# Patient Record
Sex: Female | Born: 1970 | Race: Black or African American | Hispanic: No | Marital: Single | Smoking: Current every day smoker
Health system: Southern US, Community
[De-identification: ages and names within clinical notes are randomized; demographics above are authoritative.]

## PROBLEM LIST (undated history)

## (undated) DIAGNOSIS — N200 Calculus of kidney: Secondary | ICD-10-CM

## (undated) DIAGNOSIS — R319 Hematuria, unspecified: Secondary | ICD-10-CM

## (undated) DIAGNOSIS — R102 Pelvic and perineal pain: Secondary | ICD-10-CM

## (undated) HISTORY — DX: Hematuria, unspecified: R31.9

## (undated) HISTORY — PX: OVARIAN CYST REMOVAL: SHX89

## (undated) HISTORY — DX: Calculus of kidney: N20.0

## (undated) HISTORY — DX: Pelvic and perineal pain: R10.2

---

## 1998-10-05 HISTORY — PX: BREAST EXCISIONAL BIOPSY: SUR124

## 1998-10-05 HISTORY — PX: BREAST SURGERY: SHX581

## 2004-07-29 ENCOUNTER — Ambulatory Visit: Payer: Self-pay

## 2004-12-25 ENCOUNTER — Ambulatory Visit: Payer: Self-pay | Admitting: Unknown Physician Specialty

## 2007-07-22 ENCOUNTER — Ambulatory Visit: Payer: Self-pay | Admitting: Unknown Physician Specialty

## 2011-08-31 ENCOUNTER — Ambulatory Visit: Payer: Self-pay | Admitting: Obstetrics and Gynecology

## 2012-09-07 ENCOUNTER — Ambulatory Visit: Payer: Self-pay | Admitting: Obstetrics and Gynecology

## 2014-09-11 DIAGNOSIS — R102 Pelvic and perineal pain: Secondary | ICD-10-CM | POA: Insufficient documentation

## 2014-10-09 ENCOUNTER — Ambulatory Visit: Payer: Self-pay

## 2014-10-11 ENCOUNTER — Ambulatory Visit: Payer: Self-pay | Admitting: Obstetrics and Gynecology

## 2014-10-11 LAB — CBC WITH DIFFERENTIAL/PLATELET
BASOS PCT: 0.2 %
Basophil #: 0 10*3/uL (ref 0.0–0.1)
Eosinophil #: 0 10*3/uL (ref 0.0–0.7)
Eosinophil %: 0.6 %
HCT: 38.5 % (ref 35.0–47.0)
HGB: 12.4 g/dL (ref 12.0–16.0)
Lymphocyte #: 3.1 10*3/uL (ref 1.0–3.6)
Lymphocyte %: 57.2 %
MCH: 28 pg (ref 26.0–34.0)
MCHC: 32.1 g/dL (ref 32.0–36.0)
MCV: 87 fL (ref 80–100)
MONOS PCT: 8.8 %
Monocyte #: 0.5 x10 3/mm (ref 0.2–0.9)
Neutrophil #: 1.8 10*3/uL (ref 1.4–6.5)
Neutrophil %: 33.2 %
Platelet: 228 10*3/uL (ref 150–440)
RBC: 4.42 10*6/uL (ref 3.80–5.20)
RDW: 12.6 % (ref 11.5–14.5)
WBC: 5.4 10*3/uL (ref 3.6–11.0)

## 2014-10-11 LAB — BASIC METABOLIC PANEL
Anion Gap: 6 — ABNORMAL LOW (ref 7–16)
BUN: 10 mg/dL (ref 7–18)
CREATININE: 0.74 mg/dL (ref 0.60–1.30)
Calcium, Total: 8.8 mg/dL (ref 8.5–10.1)
Chloride: 106 mmol/L (ref 98–107)
Co2: 29 mmol/L (ref 21–32)
EGFR (Non-African Amer.): >60
Glucose: 84 mg/dL (ref 65–99)
Osmolality: 279 (ref 275–301)
Potassium: 3.5 mmol/L (ref 3.5–5.1)
SODIUM: 141 mmol/L (ref 136–145)

## 2014-10-22 ENCOUNTER — Ambulatory Visit: Payer: Self-pay | Admitting: Obstetrics and Gynecology

## 2014-11-06 DIAGNOSIS — N809 Endometriosis, unspecified: Secondary | ICD-10-CM | POA: Insufficient documentation

## 2015-01-28 LAB — SURGICAL PATHOLOGY

## 2015-02-03 NOTE — Op Note (Signed)
PATIENT NAME:  Carol Serrano, Carol Serrano MR#:  [id number] DATE OF BIRTH:  16-Nov-1970  DATE OF PROCEDURE:  10/22/2014  PREOPERATIVE DIAGNOSIS: Chronic pelvic pain.   POSTOPERATIVE DIAGNOSES: Chronic pelvic pain.   PROCEDURE PERFORMED: Diagnostic laparoscopy with bilateral salpingectomy.   SURGEON: Cline Cools, MD  ESTIMATED BLOOD LOSS: Minimal.   INTRAVENOUS FLUIDS: 700 mL    URINE OUTPUT: 75 mL    ANESTHESIA: General.   COMPLICATIONS: None.   FINDINGS: Pelvic adhesions in the left lower quadrant from the bladder to the uterus hypervascularity in pelvic vessels. No obvious evidence of endometriosis. Enlarged fallopian tubes, bilaterally.   INDICATION FOR PROCEDURE: Carol Serrano is a 44 year old female with a history of achy pelvic pain that worsens just before menses each month, but has not been relieved by oral contraceptive pills. She has a history of ovarian cysts with imaging that showed several 1 cm simple cysts, and the left 1 cm cyst with a single septation. She has previously undergone laparoscopy with ovarian cyst removal and improvement in her pain. She has a history of tubal ligation and cesarean section.   PROCEDURE IN DETAIL: The patient was brought to the Operating Room, where she was identified by name and birthdate. She was placed on the operating table in supine position and general anesthesia induced without difficulty. She was then repositioned in dorsal lithotomy position and prepped and draped in the usual sterile fashion.   A weighted speculum was placed in the vagina, and a single-tooth tenaculum was used to grasp the anterior lip of the vagina. Because the patient has a history of endometrial ablation, her cervix was unable to be dilated to introduce a uterine manipulator. For this reason, the instruments were removed from the vagina, and a sponge stick was placed in the vagina to manipulate the uterus.   A laparoscopic 10 cm port was placed in the umbilicus and  the 10 mm trocar was introduced under direct visualization. The abdomen was surveyed and no gross abnormalities were noted at the site of entry or in the upper quadrants of the abdomen. In the pelvis, the findings above were noticed. A 5 mm port was placed in the left lower quadrant and in the right lower quadrant at the site of prior scars. Full examination of the pelvis revealed bilaterally enlarged  fallopian tubes. The ovaries, however, looked within normal limits. There were pelvic adhesions in the left lower quadrant; these were taken down carefully to restore the normal anatomy of the left adnexa to the pelvic sidewall. There were no complications with this lysis of adhesions. The fallopian tubes were removed using a harmonic dissector bilaterally, and evaluation of the pedicles revealed no bleeding. The right lower quadrant incision was enlarged to accommodate a 10 mm Endo Catch bag. The tubes were placed in the bag, and removed from the pelvis. There was no evidence of endometriosis, and no biopsies were taken. There was very little free pelvic fluid and the ovaries looked overall grossly normal. The decision was made not to proceed with ovarian cystectomy.   The trocars were then removed from the abdomen under direct visualization in the bilateral lower quadrants. The patient's abdomen was desufflated and the adnexa were noted to be hemostatic, under less pressure. The umbilical port was then removed. The two 10 mm port site fascial defects were closed using 0 Vicryl suture, and subcuticular stitches were placed to close the skin. Steri-Strips were applied. The general anesthesia was reversed without difficulty.   The  patient is in stable condition in the PACU.    ____________________________ Cline CoolsBethany E. Jonahtan Manseau, MD beb:MT D: 10/22/2014 09:31:06 ET T: 10/22/2014 10:04:29 ET JOB#: [id number]  cc: Cline CoolsBethany E. Quron Ruddy, MD, <Dictator> Cline CoolsBETHANY E Nashali Ditmer MD ELECTRONICALLY SIGNED 10/30/2014 17:49

## 2015-03-25 ENCOUNTER — Other Ambulatory Visit: Payer: Self-pay | Admitting: Family Medicine

## 2015-03-25 DIAGNOSIS — R310 Gross hematuria: Secondary | ICD-10-CM

## 2015-05-07 ENCOUNTER — Other Ambulatory Visit: Payer: Self-pay

## 2015-05-07 DIAGNOSIS — N200 Calculus of kidney: Secondary | ICD-10-CM

## 2015-07-23 ENCOUNTER — Ambulatory Visit: Payer: Self-pay | Admitting: Urology

## 2015-08-06 ENCOUNTER — Encounter: Payer: Self-pay | Admitting: Urology

## 2015-08-06 ENCOUNTER — Ambulatory Visit (INDEPENDENT_AMBULATORY_CARE_PROVIDER_SITE_OTHER): Payer: 59 | Admitting: Urology

## 2015-08-06 ENCOUNTER — Ambulatory Visit
Admission: RE | Admit: 2015-08-06 | Discharge: 2015-08-06 | Disposition: A | Discharge status: Home / Self Care | Payer: 59 | Source: Ambulatory Visit | Attending: Urology | Admitting: Urology

## 2015-08-06 VITALS — BP 170/68 | HR 85 | Ht 65.0 in | Wt 152.3 lb

## 2015-08-06 DIAGNOSIS — N200 Calculus of kidney: Secondary | ICD-10-CM

## 2015-08-06 DIAGNOSIS — R310 Gross hematuria: Secondary | ICD-10-CM

## 2015-08-06 DIAGNOSIS — R319 Hematuria, unspecified: Secondary | ICD-10-CM | POA: Insufficient documentation

## 2015-08-06 LAB — URINALYSIS, COMPLETE
BILIRUBIN UA: NEGATIVE
Glucose, UA: NEGATIVE
KETONES UA: NEGATIVE
Leukocytes, UA: NEGATIVE
NITRITE UA: NEGATIVE
PH UA: 7 (ref 5.0–7.5)
Protein, UA: NEGATIVE
SPEC GRAV UA: 1.025 (ref 1.005–1.030)
Urobilinogen, Ur: 0.2 mg/dL (ref 0.2–1.0)

## 2015-08-06 LAB — MICROSCOPIC EXAMINATION
RENAL EPITHEL UA: NONE SEEN /HPF
WBC, UA: NONE SEEN /hpf (ref 0–?)

## 2015-08-06 NOTE — Progress Notes (Signed)
08/06/2015 11:20 AM   Carol Serrano Roanna Banning August 27, 1971 [id number]  Referring provider: No referring provider defined for this encounter.  Chief Complaint  Patient presents with  . Nephrolithiasis    3month w/KUB    HPI: Known right renal calculus    PMH: Past Medical History  Diagnosis Date  . Pelvic pain in female   . Hematuria   . Renal calculus     Surgical History: Past Surgical History  Procedure Laterality Date  . Ovarian cyst removal    . Breast surgery  2000  . Cesarean section      Home Medications:    Medication List    Notice  As of 08/06/2015 11:20 AM   You have not been prescribed any medications.      Allergies: No Known Allergies  Family History: Family History  Problem Relation Age of Onset  . Bladder Cancer Neg Hx   . Kidney cancer Neg Hx   . COPD Father   . Colon cancer    . Breast cancer    . Hypertension Mother   . Hypertension Father     Social History:  reports that she has been smoking Cigarettes.  She has been smoking about 0.50 packs per day. She does not have any smokeless tobacco history on file. She reports that she drinks alcohol. She reports that she does not use illicit drugs.  ROS: UROLOGY Frequent Urination?: No Hard to postpone urination?: No Burning/pain with urination?: No Get up at night to urinate?: No Leakage of urine?: No Urine stream starts and stops?: No Trouble starting stream?: No Do you have to strain to urinate?: No Blood in urine?: No Urinary tract infection?: No Sexually transmitted disease?: No Injury to kidneys or bladder?: No Painful intercourse?: No Weak stream?: No Currently pregnant?: No Vaginal bleeding?: No Last menstrual period?: n  Gastrointestinal Nausea?: No Vomiting?: No Indigestion/heartburn?: No Diarrhea?: No Constipation?: No  Constitutional Fever: No Night sweats?: No Weight loss?: No Fatigue?: No  Skin Skin rash/lesions?: No Itching?: No  Eyes Blurred vision?:  No Double vision?: No  Ears/Nose/Throat Sore throat?: No Sinus problems?: No  Hematologic/Lymphatic Swollen glands?: No Easy bruising?: No  Cardiovascular Leg swelling?: No Chest pain?: No  Respiratory Cough?: No Shortness of breath?: No  Endocrine Excessive thirst?: No  Musculoskeletal Back pain?: No Joint pain?: No  Neurological Headaches?: No Dizziness?: No  Psychologic Depression?: No Anxiety?: No  Physical Exam: BP 170/68 mmHg  Pulse 85  Ht  (1.651 m)  Wt 152 lb 4.8 oz (69.083 kg)  BMI 25.34 kg/m2  LMP 07/08/2015 (Exact Date)  Constitutional:  Alert and oriented, No acute distress. HEENT: [place] AT, moist mucus membranes.  Trachea midline, no masses. Cardiovascular: No clubbing, cyanosis, or edema. Respiratory: Normal respiratory effort, no increased work of breathing. GI: Abdomen is soft, nontender, nondistended, no abdominal masses GU: No CVA tenderness.  Skin: No rashes, bruises or suspicious lesions. Lymph: No cervical or inguinal adenopathy. Neurologic: Grossly intact, no focal deficits, moving all 4 extremities. Psychiatric: Normal mood and affect.  Laboratory Data: Lab Results  Component Value Date   WBC 5.4 10/11/2014   HGB 12.4 10/11/2014   HCT 38.5 10/11/2014   MCV 87 10/11/2014   PLT 228 10/11/2014    Lab Results  Component Value Date   CREATININE 0.74 10/11/2014    No results found for: PSA  No results found for: TESTOSTERONE  No results found for: HGBA1C  Urinalysis No results found for: COLORURINE, APPEARANCEUR, LABSPEC,  PHURINE, GLUCOSEU, HGBUR, BILIRUBINUR, KETONESUR, PROTEINUR, UROBILINOGEN, NITRITE, LEUKOCYTESUR  Pertinent Imaging: KUB  Assessment & Plan: KUB 8 months to follow progress of a right lower pole renal calculus that is gone from 2 mm to 3 mm over the last year   Abdomen 1 view (KUB); Future KUB in 1 year    No Follow-up on file.  Lorraine Laxichard D Hart, MD  [facility] [address], [address] [place], [place] [address] [phone]

## 2015-08-06 NOTE — Progress Notes (Signed)
Patient has been seen in the past. She has a right calculus in the lower pole right kidney that is nonobstructive. It is approximately 3 mm in size. It is increasingly elevated over the past 8 months plan another KUB months to recheck her major complaint is left flank pain in his flank and secured

## 2016-04-14 ENCOUNTER — Ambulatory Visit (INDEPENDENT_AMBULATORY_CARE_PROVIDER_SITE_OTHER): Payer: 59 | Admitting: Urology

## 2016-04-14 ENCOUNTER — Ambulatory Visit
Admission: RE | Admit: 2016-04-14 | Discharge: 2016-04-14 | Disposition: A | Discharge status: Home / Self Care | Payer: 59 | Source: Ambulatory Visit | Attending: Urology | Admitting: Urology

## 2016-04-14 ENCOUNTER — Encounter: Payer: Self-pay | Admitting: Urology

## 2016-04-14 VITALS — BP 163/79 | HR 78 | Ht 65.0 in | Wt 151.0 lb

## 2016-04-14 DIAGNOSIS — N200 Calculus of kidney: Secondary | ICD-10-CM

## 2016-04-14 DIAGNOSIS — R3129 Other microscopic hematuria: Secondary | ICD-10-CM | POA: Diagnosis not present

## 2016-04-14 LAB — MICROSCOPIC EXAMINATION

## 2016-04-14 LAB — URINALYSIS, COMPLETE
Bilirubin, UA: NEGATIVE
GLUCOSE, UA: NEGATIVE
KETONES UA: NEGATIVE
LEUKOCYTES UA: NEGATIVE
Nitrite, UA: NEGATIVE
Protein, UA: NEGATIVE
Specific Gravity, UA: 1.025 (ref 1.005–1.030)
Urobilinogen, Ur: 0.2 mg/dL (ref 0.2–1.0)
pH, UA: 5.5 (ref 5.0–7.5)

## 2016-04-14 NOTE — Progress Notes (Signed)
04/14/2016 2:56 PM   Marily Memos Roanna Banning Mar 03, 1971 [id number]  Referring provider: No referring provider defined for this encounter.  Chief Complaint  Patient presents with  . Nephrolithiasis    53month w/KUB    HPI: 45 year old female who presents today for routine follow-up.   She was previously followed by Dr. Edwyna Shell and was last seen in 08/2015. She is a personal history of kidney stones and was being followed for a ? right lower pole nonobstructing stone per Dr. Scheryl Darter last note had increased in size to 3 mm from 2 mm.  Review of KUB last year shows no obvious stones.  She is never had surgical intervention for stone in the past.  Today, she has no urinary complaints. She denies gross hematuria or urinary tract infections. She has no flank pain.  KUB today is negative with no stones identified within the urinary tract.  She does have incidental microscopic hematuria today.  She is a current smoker.  PMH: Past Medical History  Diagnosis Date  . Pelvic pain in female   . Hematuria   . Renal calculus     Surgical History: Past Surgical History  Procedure Laterality Date  . Ovarian cyst removal    . Breast surgery  2000  . Cesarean section      Home Medications:    Medication List    Notice  As of 04/14/2016 11:59 PM   You have not been prescribed any medications.      Allergies: No Known Allergies  Family History: Family History  Problem Relation Age of Onset  . Bladder Cancer Neg Hx   . Kidney cancer Neg Hx   . COPD Father   . Colon cancer    . Breast cancer    . Hypertension Mother   . Hypertension Father     Social History:  reports that she has been smoking Cigarettes.  She has been smoking about 0.50 packs per day. She does not have any smokeless tobacco history on file. She reports that she drinks alcohol. She reports that she does not use illicit drugs.  ROS: UROLOGY Frequent Urination?: No Hard to postpone urination?: No Burning/pain with  urination?: No Get up at night to urinate?: No Leakage of urine?: No Urine stream starts and stops?: No Trouble starting stream?: No Do you have to strain to urinate?: No Blood in urine?: No Sexually transmitted disease?: No Injury to kidneys or bladder?: No Painful intercourse?: No Weak stream?: No Currently pregnant?: No Vaginal bleeding?: No Last menstrual period?: n  Gastrointestinal Nausea?: No Vomiting?: No Indigestion/heartburn?: No Diarrhea?: No Constipation?: No  Constitutional Fever: No Night sweats?: No Weight loss?: No Fatigue?: No  Skin Skin rash/lesions?: No Itching?: No  Eyes Blurred vision?: No Double vision?: No  Ears/Nose/Throat Sore throat?: No Sinus problems?: No  Hematologic/Lymphatic Swollen glands?: No Easy bruising?: No  Cardiovascular Leg swelling?: No Chest pain?: No  Respiratory Cough?: No Shortness of breath?: No  Endocrine Excessive thirst?: No  Musculoskeletal Back pain?: Yes Joint pain?: No  Neurological Headaches?: No Dizziness?: No  Psychologic Depression?: No Anxiety?: No  Physical Exam: BP 163/79 mmHg  Pulse 78  Ht  (1.651 m)  Wt 151 lb (68.493 kg)  BMI 25.13 kg/m2  LMP 03/21/2016  Constitutional:  Alert and oriented, No acute distress. HEENT: [place] AT, moist mucus membranes.  Trachea midline, no masses. Cardiovascular: No clubbing, cyanosis, or edema. Respiratory: Normal respiratory effort, no increased work of breathing. GI: Abdomen is soft, nontender, nondistended,  no abdominal masses GU: No CVA tenderness.  Skin: No rashes, bruises or suspicious lesions. Neurologic: Grossly intact, no focal deficits, moving all 4 extremities. Psychiatric: Normal mood and affect.  Laboratory Data: Lab Results  Component Value Date   WBC 5.4 10/11/2014   HGB 12.4 10/11/2014   HCT 38.5 10/11/2014   MCV 87 10/11/2014   PLT 228 10/11/2014    Lab Results  Component Value Date   CREATININE 0.74 10/11/2014    Urinalysis Results for orders placed or performed in visit on 04/14/16  Microscopic Examination  Result Value Ref Range   WBC, UA 0-5 0 -  5 /hpf   RBC, UA 3-10 (A) 0 -  2 /hpf   Epithelial Cells (non renal) 0-10 0 - 10 /hpf   Mucus, UA Present (A) Not Estab.   Bacteria, UA Few (A) None seen/Few  Urinalysis, Complete  Result Value Ref Range   Specific Gravity, UA 1.025 1.005 - 1.030   pH, UA 5.5 5.0 - 7.5   Color, UA Yellow Yellow   Appearance Ur Clear Clear   Leukocytes, UA Negative Negative   Protein, UA Negative Negative/Trace   Glucose, UA Negative Negative   Ketones, UA Negative Negative   RBC, UA 1+ (A) Negative   Bilirubin, UA Negative Negative   Urobilinogen, Ur 0.2 0.2 - 1.0 mg/dL   Nitrite, UA Negative Negative   Microscopic Examination See below:     Pertinent Imaging:    Study Result     CLINICAL DATA: Renal calculus.  EXAM: ABDOMEN - 1 VIEW  COMPARISON: 08/06/2015.  FINDINGS: Bilateral pelvic phleboliths are unchanged. No calcified urinary tract calculi are seen. Normal bowel gas pattern and unremarkable bones.  IMPRESSION: No calcified urinary tract calculi seen.   Electronically Signed  By: Beckie SaltsSteven Reid M.D.  On: 04/14/2016 16:27    KUB reviewed today personally and with the patient. Also compared to a previous KUB and CT scans.  Assessment & Plan:    1. Nephrolithiasis No appreciable stones on previous 2 KUB examinations. CT scan in the past shown punctate calcifications only. I do not think that she is further follow-up for this. She is otherwise asymptomatic and not pass any stones in the recent past.   We did review tone prevention techniques today in detail, handout information given - Urinalysis, Complete  2. Microscopic hematuria Incidental microscopic hematuria 1 in a smoker today. Currently, she does not need AUA guidelines for hematuria workup -must have 2 episodes in the absence of infection in one year I have  recommended a repeat urinalysis to assess further   Return for UA- lab only.  Vanna ScotlandAshley Aryn Safran, MD  [facility] [address], [address] [place], [place] [address] [phone]

## 2016-04-14 NOTE — Patient Instructions (Signed)
Dietary Guidelines to Help Prevent Kidney Stones Your risk of kidney stones can be decreased by adjusting the foods you eat. The most important thing you can do is drink enough fluid. You should drink enough fluid to keep your urine clear or pale yellow. The following guidelines provide specific information for the type of kidney stone you have had. GUIDELINES ACCORDING TO TYPE OF KIDNEY STONE Calcium Oxalate Kidney Stones  Reduce the amount of salt you eat. Foods that have a lot of salt cause your body to release excess calcium into your urine. The excess calcium can combine with a substance called oxalate to form kidney stones.  Reduce the amount of animal protein you eat if the amount you eat is excessive. Animal protein causes your body to release excess calcium into your urine. Ask your dietitian how much protein from animal sources you should be eating.  Avoid foods that are high in oxalates. If you take vitamins, they should have less than 500 mg of vitamin C. Your body turns vitamin C into oxalates. You do not need to avoid fruits and vegetables high in vitamin C. Calcium Phosphate Kidney Stones  Reduce the amount of salt you eat to help prevent the release of excess calcium into your urine.  Reduce the amount of animal protein you eat if the amount you eat is excessive. Animal protein causes your body to release excess calcium into your urine. Ask your dietitian how much protein from animal sources you should be eating.  Get enough calcium from food or take a calcium supplement (ask your dietitian for recommendations). Food sources of calcium that do not increase your risk of kidney stones include:  Broccoli.  Dairy products, such as cheese and yogurt.  Pudding. Uric Acid Kidney Stones  Do not have more than 6 oz of animal protein per day. FOOD SOURCES Animal Protein Sources  Meat (all types).  Poultry.  Eggs.  Fish, seafood. Foods High in Salt  Salt seasonings.  Soy  sauce.  Teriyaki sauce.  Cured and processed meats.  Salted crackers and snack foods.  Fast food.  Canned soups and most canned foods. Foods High in Oxalates  Grains:  Amaranth.  Barley.  Grits.  Wheat germ.  Bran.  Buckwheat flour.  All bran cereals.  Pretzels.  Whole wheat bread.  Vegetables:  Beans (wax).  Beets and beet greens.  Collard greens.  Eggplant.  Escarole.  Leeks.  Okra.  Parsley.  Rutabagas.  Spinach.  Swiss chard.  Tomato paste.  Fried potatoes.  Sweet potatoes.  Fruits:  Red currants.  Figs.  Kiwi.  Rhubarb.  Meat and Other Protein Sources:  Beans (dried).  Soy burgers and other soybean products.  Miso.  Nuts (peanuts, almonds, pecans, cashews, hazelnuts).  Nut butters.  Sesame seeds and tahini (paste made of sesame seeds).  Poppy seeds.  Beverages:  Chocolate drink mixes.  Soy milk.  Instant iced tea.  Juices made from high-oxalate fruits or vegetables.  Other:  Carob.  Chocolate.  Fruitcake.  Marmalades.   This information is not intended to replace advice given to you by your health care provider. Make sure you discuss any questions you have with your health care provider.   Document Released: 01/16/2011 Document Revised: 09/26/2013 Document Reviewed: 08/18/2013 Elsevier Interactive Patient Education 2016 [employer] Inc[employer]  

## 2016-04-20 ENCOUNTER — Telehealth: Payer: Self-pay | Admitting: Urology

## 2016-04-20 ENCOUNTER — Encounter: Payer: Self-pay | Admitting: Urology

## 2016-04-20 DIAGNOSIS — R319 Hematuria, unspecified: Secondary | ICD-10-CM

## 2016-04-20 NOTE — Telephone Encounter (Signed)
Please note this patient known that she had incidental microscopic blood in her urine today. This was not appreciated on her previous urinalysis.  I think is important given her smoking history that she repeat a urinalysis to confirm a or dispute its presence. Please let her know that the guidelines recommend further workup if she does have 2 episodes within one year.  These arrange her to come back in again for a repeat urine specimen with the proper collection technique.  Vanna ScotlandAshley Jakylah Bassinger, MD

## 2016-04-20 NOTE — Telephone Encounter (Signed)
Spoke with pt in reference to micro u/a. Pt will RTC tomorrow for clean catch specimen. Orders placed.

## 2016-04-21 ENCOUNTER — Other Ambulatory Visit: Payer: 59

## 2016-04-21 ENCOUNTER — Encounter: Payer: Self-pay | Admitting: Urology

## 2016-04-21 DIAGNOSIS — R319 Hematuria, unspecified: Secondary | ICD-10-CM

## 2016-04-21 LAB — URINALYSIS, COMPLETE
BILIRUBIN UA: NEGATIVE
Glucose, UA: NEGATIVE
Ketones, UA: NEGATIVE
LEUKOCYTES UA: NEGATIVE
Nitrite, UA: NEGATIVE
PH UA: 6 (ref 5.0–7.5)
Protein, UA: NEGATIVE
Urobilinogen, Ur: 0.2 mg/dL (ref 0.2–1.0)

## 2016-04-21 LAB — MICROSCOPIC EXAMINATION: BACTERIA UA: NONE SEEN

## 2016-04-23 ENCOUNTER — Telehealth: Payer: Self-pay

## 2016-04-23 NOTE — Telephone Encounter (Signed)
-----   Message from Vanna ScotlandAshley Brandon, MD sent at 04/21/2016  3:25 PM EDT ----- UA looks good today, no further work up needed  Vanna ScotlandAshley Brandon, MD

## 2016-04-23 NOTE — Telephone Encounter (Signed)
Spoke with pt in reference to u/a results. Pt voiced understanding.  

## 2016-12-24 ENCOUNTER — Other Ambulatory Visit: Payer: Self-pay | Admitting: Obstetrics and Gynecology

## 2016-12-24 DIAGNOSIS — Z1231 Encounter for screening mammogram for malignant neoplasm of breast: Secondary | ICD-10-CM

## 2017-01-26 ENCOUNTER — Ambulatory Visit
Admission: RE | Admit: 2017-01-26 | Discharge: 2017-01-26 | Disposition: A | Discharge status: Home / Self Care | Payer: 59 | Source: Ambulatory Visit | Attending: Obstetrics and Gynecology | Admitting: Obstetrics and Gynecology

## 2017-01-26 DIAGNOSIS — Z1231 Encounter for screening mammogram for malignant neoplasm of breast: Secondary | ICD-10-CM | POA: Insufficient documentation

## 2017-02-08 ENCOUNTER — Inpatient Hospital Stay
Admission: RE | Admit: 2017-02-08 | Discharge: 2017-02-08 | Disposition: A | Discharge status: Home / Self Care | Payer: Self-pay | Source: Ambulatory Visit | Attending: *Deleted | Admitting: *Deleted

## 2017-02-08 ENCOUNTER — Other Ambulatory Visit: Payer: Self-pay | Admitting: *Deleted

## 2017-02-08 DIAGNOSIS — Z9289 Personal history of other medical treatment: Secondary | ICD-10-CM

## 2017-07-19 ENCOUNTER — Emergency Department
Admission: EM | Admit: 2017-07-19 | Discharge: 2017-07-19 | Disposition: A | Discharge status: Home / Self Care | Payer: 59 | Attending: Emergency Medicine | Admitting: Emergency Medicine

## 2017-07-19 DIAGNOSIS — I83813 Varicose veins of bilateral lower extremities with pain: Secondary | ICD-10-CM | POA: Diagnosis not present

## 2017-07-19 DIAGNOSIS — R200 Anesthesia of skin: Secondary | ICD-10-CM | POA: Diagnosis not present

## 2017-07-19 DIAGNOSIS — R609 Edema, unspecified: Secondary | ICD-10-CM

## 2017-07-19 DIAGNOSIS — F17210 Nicotine dependence, cigarettes, uncomplicated: Secondary | ICD-10-CM | POA: Insufficient documentation

## 2017-07-19 DIAGNOSIS — I8393 Asymptomatic varicose veins of bilateral lower extremities: Secondary | ICD-10-CM

## 2017-07-19 DIAGNOSIS — R202 Paresthesia of skin: Secondary | ICD-10-CM | POA: Insufficient documentation

## 2017-07-19 DIAGNOSIS — R600 Localized edema: Secondary | ICD-10-CM | POA: Diagnosis present

## 2017-07-19 LAB — URINALYSIS, COMPLETE (UACMP) WITH MICROSCOPIC
BILIRUBIN URINE: NEGATIVE
Glucose, UA: NEGATIVE mg/dL
Ketones, ur: NEGATIVE mg/dL
LEUKOCYTES UA: NEGATIVE
NITRITE: NEGATIVE
PH: 5 (ref 5.0–8.0)
Protein, ur: NEGATIVE mg/dL
SPECIFIC GRAVITY, URINE: 1.024 (ref 1.005–1.030)

## 2017-07-19 LAB — CBC
HEMATOCRIT: 37.9 % (ref 35.0–47.0)
Hemoglobin: 12.3 g/dL (ref 12.0–16.0)
MCH: 28 pg (ref 26.0–34.0)
MCHC: 32.5 g/dL (ref 32.0–36.0)
MCV: 86.4 fL (ref 80.0–100.0)
PLATELETS: 182 10*3/uL (ref 150–440)
RBC: 4.38 MIL/uL (ref 3.80–5.20)
RDW: 12.8 % (ref 11.5–14.5)
WBC: 5.6 10*3/uL (ref 3.6–11.0)

## 2017-07-19 LAB — BASIC METABOLIC PANEL
Anion gap: 7 (ref 5–15)
BUN: 16 mg/dL (ref 6–20)
CO2: 27 mmol/L (ref 22–32)
Calcium: 9.1 mg/dL (ref 8.9–10.3)
Chloride: 105 mmol/L (ref 101–111)
Creatinine, Ser: 0.67 mg/dL (ref 0.44–1.00)
Glucose, Bld: 93 mg/dL (ref 65–99)
POTASSIUM: 3.4 mmol/L — AB (ref 3.5–5.1)
SODIUM: 139 mmol/L (ref 135–145)

## 2017-07-19 LAB — POCT PREGNANCY, URINE: Preg Test, Ur: NEGATIVE

## 2017-07-19 NOTE — ED Notes (Signed)
Pt able to ambulate at discharge without difficulty. NAD noted at this itme.

## 2017-07-19 NOTE — ED Notes (Signed)
Pt presents with unexplained bruising and tingling in her legs. Bruising started about 3 weeks ago, and tingling started Saturday. Denies any injury; states "my legs feel tired." Pt denies any medication change or lifestyle changes to explain the leg fatigue. Pt alert & oriented with NAD noted.

## 2017-07-19 NOTE — ED Triage Notes (Signed)
Bilateral lower leg weakness, bruising to legs without cause. Pt alert and oriented X4, active, cooperative, pt in NAD. RR even and unlabored, color WNL.

## 2017-07-19 NOTE — ED Provider Notes (Signed)
[facility] Emergency Department Provider Note  ____________________________________________   First MD Initiated Contact with Patient 07/19/17 1513     (approximate)  I have reviewed the triage vital signs and the nursing notes.   HISTORY  Chief Complaint Weakness   HPI Carol Serrano is a 46 y.o. female who is presenting to the emergency department today with several weeks of intermittent lower extremity swelling and now 2 days of numbness and tingling to the bilateral lower extremities from the knees down. She says that she is also noticed bruising without known trauma to the bilateral lower extremities. She says that she is also noticed new veins that have been popping up to the bilateral lower extremities. Says that the edema goes down overnight and is not there when she wakes up in the morning. Says that her symptoms worsen when she wears high heels and improve when she wears flat shoes. Denies any gum bleeding with tooth brushing. Does not report any nosebleeds.   Past Medical History:  Diagnosis Date  . Hematuria   . Pelvic pain in female   . Renal calculus     Patient Active Problem List   Diagnosis Date Noted  . Endometriosis 11/06/2014  . Adnexal pain 09/11/2014    Past Surgical History:  Procedure Laterality Date  . BREAST EXCISIONAL BIOPSY Left 2000   benign  . BREAST SURGERY  2000  . CESAREAN SECTION    . OVARIAN CYST REMOVAL      Prior to Admission medications   Not on File    Allergies Patient has no known allergies.  Family History  Problem Relation Age of Onset  . COPD Father   . Hypertension Father   . Colon cancer Unknown   . Hypertension Mother   . Breast cancer Maternal Aunt        late 21's early 37's  . Breast cancer Maternal Grandmother        mid to late 60's  . Breast cancer Paternal Aunt   . Bladder Cancer Neg Hx   . Kidney cancer Neg Hx     Social History Social History  Substance Use Topics  .  Smoking status: Current Every Day Smoker    Packs/day: 0.50    Types: Cigarettes  . Smokeless tobacco: Not on file  . Alcohol use 0.0 oz/week     Comment: occasional    Review of Systems  Constitutional: No fever/chills Eyes: No visual changes. ENT: No sore throat. Cardiovascular: Denies chest pain. Respiratory: Denies shortness of breath. Gastrointestinal: No abdominal pain.  No nausea, no vomiting.  No diarrhea.  No constipation. Genitourinary: Negative for dysuria. Musculoskeletal: Negative for back pain. Skin: Negative for rash. Neurological: Negative for headaches   ____________________________________________   PHYSICAL EXAM:  VITAL SIGNS: ED Triage Vitals  Enc Vitals Group     BP 07/19/17 1433 (!) 156/85     Pulse Rate 07/19/17 1433 72     Resp 07/19/17 1433 18     Temp 07/19/17 1433 98.1 F (36.7 C)     Temp Source 07/19/17 1433 Oral     SpO2 07/19/17 1433 99 %     Weight 07/19/17 1434 135 lb (61.2 kg)     Height 07/19/17 1434  (1.651 m)     Head Circumference --      Peak Flow --      Pain Score --      Pain Loc --      Pain  Edu? --      Excl. in GC? --     Constitutional: Alert and oriented. Well appearing and in no acute distress. Eyes: Conjunctivae are normal.  Head: Atraumatic. Nose: No congestion/rhinnorhea. Mouth/Throat: Mucous membranes are moist.  Neck: No stridor.   Cardiovascular: Normal rate, regular rhythm. Grossly normal heart sounds.  Good peripheral circulation with equal and bilateral dorsalis pedis pulses. Respiratory: Normal respiratory effort.  No retractions. Lungs CTAB. Gastrointestinal: Soft and nontender. No distention. No CVA tenderness. Musculoskeletal: No lower extremity tenderness nor edema.  No joint effusions. no swelling to bilateral lower extremities at this time. Patient is 5 out of 5 strength to bilateral lower extremity. Sensation is intact to light touch bilaterally to the lower extremity is. Small areas of  ecchymosis to the bilateral lower extremities with largest on the left lateral thigh being about 4 cm, round and nontender. Patient also with multiple areas of small varicose veins without any ulceration or active bleeding.  Neurologic:  Normal speech and language. No gross focal neurologic deficits are appreciated. Skin:  Skin is warm, dry and intact. No rash noted. Psychiatric: Mood and affect are normal. Speech and behavior are normal.  ____________________________________________   LABS (all labs ordered are listed, but only abnormal results are displayed)  Labs Reviewed  BASIC METABOLIC PANEL - Abnormal; Notable for the following:       Result Value   Potassium 3.4 (*)    All other components within normal limits  URINALYSIS, COMPLETE (UACMP) WITH MICROSCOPIC - Abnormal; Notable for the following:    Color, Urine YELLOW (*)    APPearance CLEAR (*)    Hgb urine dipstick MODERATE (*)    Bacteria, UA RARE (*)    Squamous Epithelial / LPF 0-5 (*)    All other components within normal limits  CBC  POC URINE PREG, ED  POCT PREGNANCY, URINE  CBG MONITORING, ED   ____________________________________________  EKG  ED ECG REPORT I, Arelia Longest, the attending physician, personally viewed and interpreted this ECG.   Date: 07/19/2017  EKG Time: 1435  Rate: 70  Rhythm: normal sinus rhythm  Axis: normal  Intervals:none  ST&T Change: no ST segment elevation or depression. No abnormal T-wave inversion.  ____________________________________________  RADIOLOGY   ____________________________________________   PROCEDURES  Procedure(s) performed:   Procedures  Critical Care performed:   ____________________________________________   INITIAL IMPRESSION / ASSESSMENT AND PLAN / ED COURSE  Pertinent labs & imaging results that were available during my care of the patient were reviewed by me and considered in my medical decision making (see chart for  details).  DDX: Peripheral edema, dependent edema,varicose veins, peripheral neuropathy,  As part of my medical decision making, I reviewed the following data within the electronic MEDICAL RECORD NUMBER Old chart reviewed  we discussed compression stockings as well as keeping the legs elevated. I also discussed with the patient the need to increase her exercise like walking. She is understanding of these plans one to comply. We also discussed the likely diagnosis and the need for follow-up with primary care as well as with the vascular surgeon. The patient is understanding willing to comply and will pursue these plans. I feel that is likely the edema that is causing the patient's numbness and tingling.         ____________________________________________   FINAL CLINICAL IMPRESSION(S) / ED DIAGNOSES  peripheral edema. Varicose veins.    NEW MEDICATIONS STARTED DURING THIS VISIT:  New Prescriptions   No  medications on file     Note:  This document was prepared using Dragon voice recognition software and may include unintentional dictation errors.     Myrna Blazer, MD 07/19/17 [phone]

## 2017-08-02 ENCOUNTER — Encounter (INDEPENDENT_AMBULATORY_CARE_PROVIDER_SITE_OTHER): Payer: Self-pay | Admitting: Vascular Surgery

## 2017-08-02 ENCOUNTER — Ambulatory Visit (INDEPENDENT_AMBULATORY_CARE_PROVIDER_SITE_OTHER): Payer: 59 | Admitting: Vascular Surgery

## 2017-08-02 VITALS — BP 149/74 | HR 81 | Resp 17 | Ht 65.0 in | Wt 147.0 lb

## 2017-08-02 DIAGNOSIS — M79605 Pain in left leg: Secondary | ICD-10-CM

## 2017-08-02 DIAGNOSIS — M79604 Pain in right leg: Secondary | ICD-10-CM

## 2017-08-02 NOTE — Progress Notes (Signed)
Subjective:    Patient ID: Carol Serrano, female    DOB: 09-Aug-1971, 46 y.o.   MRN: [id number] Chief Complaint  Patient presents with  . Varicose Veins   Presents as a new patient referred by a [facility] emergency room with a chief complaint of bilateral lower extremity pain. Patient endorses tingling and burning to her bilateral feet which has worsened over the last few weeks. Patient does experience bruising to the bilateral lower extremities. This bruising is usually associated with a "knot" that she can palpate. The patient does experience some bilateral lower extremity swelling if she stands for long periods of time. Patient denies any surgery or trauma to her lower extremity. Patient denies any history of DVT. Patient denies any claudication-like history, rest pain or ulcerations or lower extremity. At this time, the patient does not wear medical grade 1 compression stockings or elevate her legs. The patient denies any fever, nausea or vomiting.   Review of Systems  Constitutional: Negative.   HENT: Negative.   Eyes: Negative.   Respiratory: Negative.   Cardiovascular:       Painful varicose veins.  Gastrointestinal: Negative.   Endocrine: Negative.   Genitourinary: Negative.   Musculoskeletal: Negative.   Skin: Negative.   Allergic/Immunologic: Negative.   Neurological: Negative.   Hematological: Negative.   Psychiatric/Behavioral: Negative.       Objective:   Physical Exam  Constitutional: She is oriented to person, place, and time. She appears well-developed and well-nourished. No distress.  HENT:  Head: Normocephalic.  Eyes: Pupils are equal, round, and reactive to light. Conjunctivae are normal.  Neck: Normal range of motion.  Cardiovascular: Normal rate, regular rhythm, normal heart sounds and intact distal pulses.   Pulses:      Radial pulses are 2+ on the right side, and 2+ on the left side.       Dorsalis pedis pulses are 2+ on the right  side, and 2+ on the left side.       Posterior tibial pulses are 2+ on the right side, and 2+ on the left side.  Pulmonary/Chest: Effort normal.  Musculoskeletal: Normal range of motion. Edema: Mild bilateral lower extremity edema.  Neurological: She is alert and oriented to person, place, and time.  Skin: Skin is warm and dry. She is not diaphoretic.  There is no stasis dermatitis. There is no skin changes. There is no cellulitis. Less than 1cm scattered varicosities noted bilaterally. Scattered bruising to the lower extremity (approximately four) bilaterally  Psychiatric: She has a normal mood and affect. Her behavior is normal. Judgment and thought content normal.  Vitals reviewed.  BP (!) 149/74 (BP Location: Right Arm)   Pulse 81   Resp 17   Ht 5\' 5"  (1.651 m)   Wt 147 lb (66.7 kg)   LMP 07/12/2017 (Approximate)   BMI 24.46 kg/m   Past Medical History:  Diagnosis Date  . Hematuria   . Pelvic pain in female   . Renal calculus    Social History   Social History  . Marital status: Single    Spouse name: N/A  . Number of children: N/A  . Years of education: N/A   Occupational History  . Not on file.   Social History Main Topics  . Smoking status: Current Every Day Smoker    Packs/day: 0.50    Types: Cigarettes  . Smokeless tobacco: Never Used  . Alcohol use 0.0 oz/week     Comment: occasional  .  Drug use: No  . Sexual activity: Not on file   Other Topics Concern  . Not on file   Social History Narrative  . No narrative on file   Past Surgical History:  Procedure Laterality Date  . BREAST EXCISIONAL BIOPSY Left 2000   benign  . BREAST SURGERY  2000  . CESAREAN SECTION    . OVARIAN CYST REMOVAL     Family History  Problem Relation Age of Onset  . COPD Father   . Hypertension Father   . Colon cancer Unknown   . Hypertension Mother   . Breast cancer Maternal Aunt        late 3540's early 4350's  . Breast cancer Maternal Grandmother        mid to late 60's   . Breast cancer Paternal Aunt   . Bladder Cancer Neg Hx   . Kidney cancer Neg Hx    No Known Allergies     Assessment & Plan:  Presents as a new patient referred by a [facility] emergency room with a chief complaint of bilateral lower extremity pain. Patient endorses tingling and burning to her bilateral feet which has worsened over the last few weeks. Patient does experience bruising to the bilateral lower extremities. This bruising is usually associated with a "knot" that she can palpate. The patient does experience some bilateral lower extremity swelling if she stands for long periods of time. Patient denies any surgery or trauma to her lower extremity. Patient denies any history of DVT. Patient denies any claudication-like history, rest pain or ulcerations or lower extremity. At this time, the patient does not wear medical grade 1 compression stockings or elevate her legs. The patient denies any fever, nausea or vomiting.  1. Pain in both lower extremities - New Patient presents with bilateral lower extremity pain and bruising. There is no acute vascular compromise to her bilateral lower extremity. Her discomfort has progressed to the point she is unable to function on a daily basis. I will order an ABI to rule out any contributing peripheral artery disease I will also order a venous duplex to rule out any contributing venous disease. The patient was encouraged to wear graduated compression stockings (20-30 mmHg) on a daily basis. The patient was instructed to begin wearing the stockings first thing in the morning and removing them in the evening. The patient was instructed specifically not to sleep in the stockings. Prescription given In addition, behavioral modification including elevation during the day will be initiated. Anti-inflammatories for pain.  No current outpatient prescriptions on file prior to visit.   No current facility-administered medications on  file prior to visit.     There are no Patient Instructions on file for this visit. No Follow-up on file.   [name] A Phong Isenberg, PA-C

## 2017-11-03 ENCOUNTER — Encounter (INDEPENDENT_AMBULATORY_CARE_PROVIDER_SITE_OTHER): Payer: 59

## 2017-11-03 ENCOUNTER — Ambulatory Visit (INDEPENDENT_AMBULATORY_CARE_PROVIDER_SITE_OTHER): Payer: 59 | Admitting: Vascular Surgery

## 2017-11-23 ENCOUNTER — Other Ambulatory Visit (INDEPENDENT_AMBULATORY_CARE_PROVIDER_SITE_OTHER): Payer: Self-pay | Admitting: Vascular Surgery

## 2017-11-23 DIAGNOSIS — M79662 Pain in left lower leg: Principal | ICD-10-CM

## 2017-11-23 DIAGNOSIS — M79661 Pain in right lower leg: Secondary | ICD-10-CM

## 2017-11-24 ENCOUNTER — Ambulatory Visit (INDEPENDENT_AMBULATORY_CARE_PROVIDER_SITE_OTHER): Payer: 59

## 2017-11-24 ENCOUNTER — Encounter (INDEPENDENT_AMBULATORY_CARE_PROVIDER_SITE_OTHER): Payer: Self-pay

## 2017-11-24 ENCOUNTER — Ambulatory Visit (INDEPENDENT_AMBULATORY_CARE_PROVIDER_SITE_OTHER): Payer: 59 | Admitting: Vascular Surgery

## 2017-11-24 ENCOUNTER — Encounter (INDEPENDENT_AMBULATORY_CARE_PROVIDER_SITE_OTHER): Payer: Self-pay | Admitting: Vascular Surgery

## 2017-11-24 VITALS — BP 139/78 | HR 64 | Resp 16 | Ht 65.0 in | Wt 146.0 lb

## 2017-11-24 DIAGNOSIS — M79661 Pain in right lower leg: Secondary | ICD-10-CM | POA: Diagnosis not present

## 2017-11-24 DIAGNOSIS — I890 Lymphedema, not elsewhere classified: Secondary | ICD-10-CM | POA: Diagnosis not present

## 2017-11-24 DIAGNOSIS — M79662 Pain in left lower leg: Secondary | ICD-10-CM

## 2017-11-24 DIAGNOSIS — I872 Venous insufficiency (chronic) (peripheral): Secondary | ICD-10-CM | POA: Diagnosis not present

## 2017-11-24 NOTE — Progress Notes (Signed)
Subjective:    Patient ID: Carol Serrano, female    DOB: 09/11/1971, 47 y.o.   MRN: [id number] Chief Complaint  Patient presents with  . Follow-up    Bilateral venous reflux   The patient presents to review vascular studies.  The patient was last seen on August 02, 2017 for evaluation of bilateral lower extremity edema and discomfort.  Since the patient's initial visit, Carol Serrano has been engaging in conservative therapy including wearing medical grade 1 compression stockings, elevating her legs and remaining active with improvement to her symptoms.  The patient continues to deny any claudication-like symptoms, rest pain or ulceration to the bilateral lower extremity.  The patient notes the edema and discomfort associated with her edema has improved.  The patient underwent a bilateral venous reflux duplex which was notable for abnormal reflux times in the bilateral common femoral vein.  No evidence of deep vein or superficial venous thrombosis in the bilateral lower extremity.  The patient denies any fever, nausea vomiting.   Review of Systems  Constitutional: Negative.   HENT: Negative.   Eyes: Negative.   Respiratory: Negative.   Cardiovascular: Positive for leg swelling.  Gastrointestinal: Negative.   Endocrine: Negative.   Genitourinary: Negative.   Musculoskeletal: Negative.   Skin: Negative.   Allergic/Immunologic: Negative.   Neurological: Negative.   Hematological: Negative.   Psychiatric/Behavioral: Negative.       Objective:   Physical Exam  Constitutional: Carol Serrano is oriented to person, place, and time. Carol Serrano appears well-developed and well-nourished. No distress.  HENT:  Head: Normocephalic and atraumatic.  Eyes: Conjunctivae are normal. Pupils are equal, round, and reactive to light.  Neck: Normal range of motion.  Cardiovascular: Normal rate, regular rhythm, normal heart sounds and intact distal pulses.  Pulses:      Radial pulses are 2+ on the right side, and 2+ on the  left side.       Dorsalis pedis pulses are 2+ on the right side, and 2+ on the left side.       Posterior tibial pulses are 2+ on the right side, and 2+ on the left side.  Pulmonary/Chest: Effort normal and breath sounds normal.  Musculoskeletal: Normal range of motion. Carol Serrano exhibits edema (Mild nonpitting edema noted bilaterally).  Neurological: Carol Serrano is alert and oriented to person, place, and time.  Skin: Skin is warm and dry. Carol Serrano is not diaphoretic.  Psychiatric: Carol Serrano has a normal mood and affect. Her behavior is normal. Judgment and thought content normal.  Vitals reviewed.  BP 139/78 (BP Location: Right Arm, Patient Position: Sitting)   Pulse 64   Resp 16   Ht 5\' 5"  (1.651 m)   Wt 146 lb (66.2 kg)   BMI 24.30 kg/m   Past Medical History:  Diagnosis Date  . Hematuria   . Pelvic pain in female   . Renal calculus    Social History   Socioeconomic History  . Marital status: Single    Spouse name: Not on file  . Number of children: Not on file  . Years of education: Not on file  . Highest education level: Not on file  Social Needs  . Financial resource strain: Not on file  . Food insecurity - worry: Not on file  . Food insecurity - inability: Not on file  . Transportation needs - medical: Not on file  . Transportation needs - non-medical: Not on file  Occupational History  . Not on file  Tobacco Use  . Smoking status:  Current Every Day Smoker    Packs/day: 0.50    Types: Cigarettes  . Smokeless tobacco: Never Used  Substance and Sexual Activity  . Alcohol use: Yes    Alcohol/week: 0.0 oz    Comment: occasional  . Drug use: No  . Sexual activity: Not on file  Other Topics Concern  . Not on file  Social History Narrative  . Not on file   Past Surgical History:  Procedure Laterality Date  . BREAST EXCISIONAL BIOPSY Left 2000   benign  . BREAST SURGERY  2000  . CESAREAN SECTION    . OVARIAN CYST REMOVAL     Family History  Problem Relation Age of Onset  .  COPD Father   . Hypertension Father   . Colon cancer Unknown   . Hypertension Mother   . Breast cancer Maternal Aunt        late 41's early 47's  . Breast cancer Maternal Grandmother        mid to late 60's  . Breast cancer Paternal Aunt   . Bladder Cancer Neg Hx   . Kidney cancer Neg Hx    No Known Allergies     Assessment & Plan:  The patient presents to review vascular studies.  The patient was last seen on August 02, 2017 for evaluation of bilateral lower extremity edema and discomfort.  Since the patient's initial visit, Carol Serrano has been engaging in conservative therapy including wearing medical grade 1 compression stockings, elevating her legs and remaining active with improvement to her symptoms.  The patient continues to deny any claudication-like symptoms, rest pain or ulceration to the bilateral lower extremity.  The patient notes the edema and discomfort associated with her edema has improved.  The patient underwent a bilateral venous reflux duplex which was notable for abnormal reflux times in the bilateral common femoral vein.  No evidence of deep vein or superficial venous thrombosis in the bilateral lower extremity.  The patient denies any fever, nausea vomiting.  1. Lymphedema - New Patient to continue engaging in conservative therapy including wearing medical grade 1 compression stockings, elevating her legs and remaining active. If conventional therapy fails in the future the patient may be a candidate for a lymphedema pump Information given to the patient about a lymphedema pump At this time, Carol Serrano is not interested in moving forward with applying for a lymphedema pump however will call the office in the future if this changes Follow-up as needed  2. Chronic venous insufficiency - New Patient with venous reflux located in the bilateral common femoral veins Due to the location in the deep venous system the patient is not a candidate for any endovenous laser ablation or  sclerotherapy Patient to continue engaging in conservative therapy including wearing medical grade 1 compression stockings elevating her legs and remaining active Patient is to follow-up as needed  Current Outpatient Medications on File Prior to Visit  Medication Sig Dispense Refill  . Multiple Vitamins-Minerals (MULTIVITAMIN ADULT PO) Take by mouth.     No current facility-administered medications on file prior to visit.    There are no Patient Instructions on file for this visit. No Follow-up on file.  Adonay Scheier A Thelma Viana, PA-C

## 2019-01-03 ENCOUNTER — Other Ambulatory Visit: Payer: Self-pay | Admitting: Obstetrics and Gynecology

## 2019-01-03 DIAGNOSIS — Z1231 Encounter for screening mammogram for malignant neoplasm of breast: Secondary | ICD-10-CM

## 2019-03-10 ENCOUNTER — Other Ambulatory Visit: Payer: Self-pay

## 2019-03-10 ENCOUNTER — Ambulatory Visit
Admission: RE | Admit: 2019-03-10 | Discharge: 2019-03-10 | Disposition: A | Discharge status: Home / Self Care | Payer: 59 | Source: Ambulatory Visit | Attending: Obstetrics and Gynecology | Admitting: Obstetrics and Gynecology

## 2019-03-10 DIAGNOSIS — Z1231 Encounter for screening mammogram for malignant neoplasm of breast: Secondary | ICD-10-CM | POA: Diagnosis not present

## 2019-03-15 ENCOUNTER — Other Ambulatory Visit: Payer: Self-pay | Admitting: Obstetrics and Gynecology

## 2019-03-15 DIAGNOSIS — R928 Other abnormal and inconclusive findings on diagnostic imaging of breast: Secondary | ICD-10-CM

## 2019-03-15 DIAGNOSIS — N6489 Other specified disorders of breast: Secondary | ICD-10-CM

## 2019-03-20 ENCOUNTER — Other Ambulatory Visit: Payer: Self-pay

## 2019-03-20 ENCOUNTER — Ambulatory Visit
Admission: RE | Admit: 2019-03-20 | Discharge: 2019-03-20 | Disposition: A | Discharge status: Home / Self Care | Payer: 59 | Source: Ambulatory Visit | Attending: Obstetrics and Gynecology | Admitting: Obstetrics and Gynecology

## 2019-03-20 DIAGNOSIS — R928 Other abnormal and inconclusive findings on diagnostic imaging of breast: Secondary | ICD-10-CM | POA: Insufficient documentation

## 2019-03-20 DIAGNOSIS — N6489 Other specified disorders of breast: Secondary | ICD-10-CM

## 2020-04-13 ENCOUNTER — Ambulatory Visit: Payer: 59 | Attending: Internal Medicine

## 2020-04-13 DIAGNOSIS — Z23 Encounter for immunization: Secondary | ICD-10-CM

## 2020-04-13 NOTE — Progress Notes (Signed)
   Covid-19 Vaccination Clinic  Name:  Kimyah Frein    MRN: [id number] DOB: 1970/11/07  04/13/2020  Ms. Toback was observed post Covid-19 immunization for 15 minutes without incident. She was provided with Vaccine Information Sheet and instruction to access the V-Safe system.   Ms. Pepitone was instructed to call 911 with any severe reactions post vaccine: Marland Kitchen Difficulty breathing  . Swelling of face and throat  . A fast heartbeat  . A bad rash all over body  . Dizziness and weakness   Immunizations Administered    Name Date Dose VIS Date Route   Pfizer COVID-19 Vaccine 04/13/2020 11:05 AM 0.3 mL 11/29/2018 Intramuscular   Manufacturer: [employer], [employer]   Lot: [id number]   NDC: [address]-1

## 2020-04-30 ENCOUNTER — Ambulatory Visit: Payer: 59 | Attending: Internal Medicine

## 2020-04-30 DIAGNOSIS — Z23 Encounter for immunization: Secondary | ICD-10-CM

## 2020-04-30 NOTE — Progress Notes (Signed)
   Covid-19 Vaccination Clinic  Name:  [name]    MRN: [id number] DOB: August 03, 1971  04/30/2020  [name] was observed post Covid-19 immunization for 15 minutes without incident. She was provided with Vaccine Information Sheet and instruction to access the V-Safe system.   [name] was instructed to call 911 with any severe reactions post vaccine: Marland Kitchen Difficulty breathing  . Swelling of face and throat  . A fast heartbeat  . A bad rash all over body  . Dizziness and weakness   Immunizations Administered    Name Date Dose VIS Date Route   Pfizer COVID-19 Vaccine 04/30/2020  1:06 PM 0.3 mL 11/29/2018 Intramuscular   Manufacturer: [employer], [employer]   Lot: [id number]   NDC: [address]-4

## 2021-03-13 IMAGING — MG DIGITAL DIAGNOSTIC UNILATERAL LEFT MAMMOGRAM WITH TOMO AND CAD
6 series · 6 of 18 positions shown · non-contrast
Comparison: Previous exam(s).

CLINICAL DATA: The patient was called back for left breast
asymmetry.

EXAM:
DIGITAL DIAGNOSTIC UNILATERAL LEFT MAMMOGRAM WITH CAD AND TOMO

[L CC synth-2D (1 of 2)]
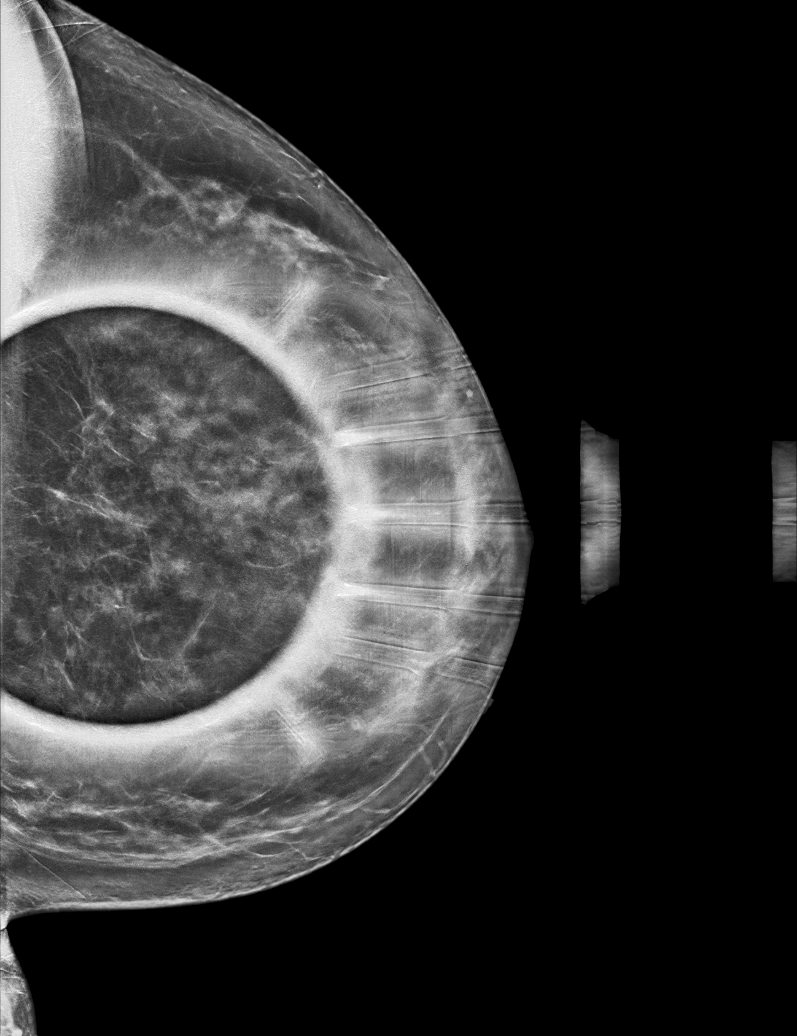

[L CC synth-2D (2 of 2)]
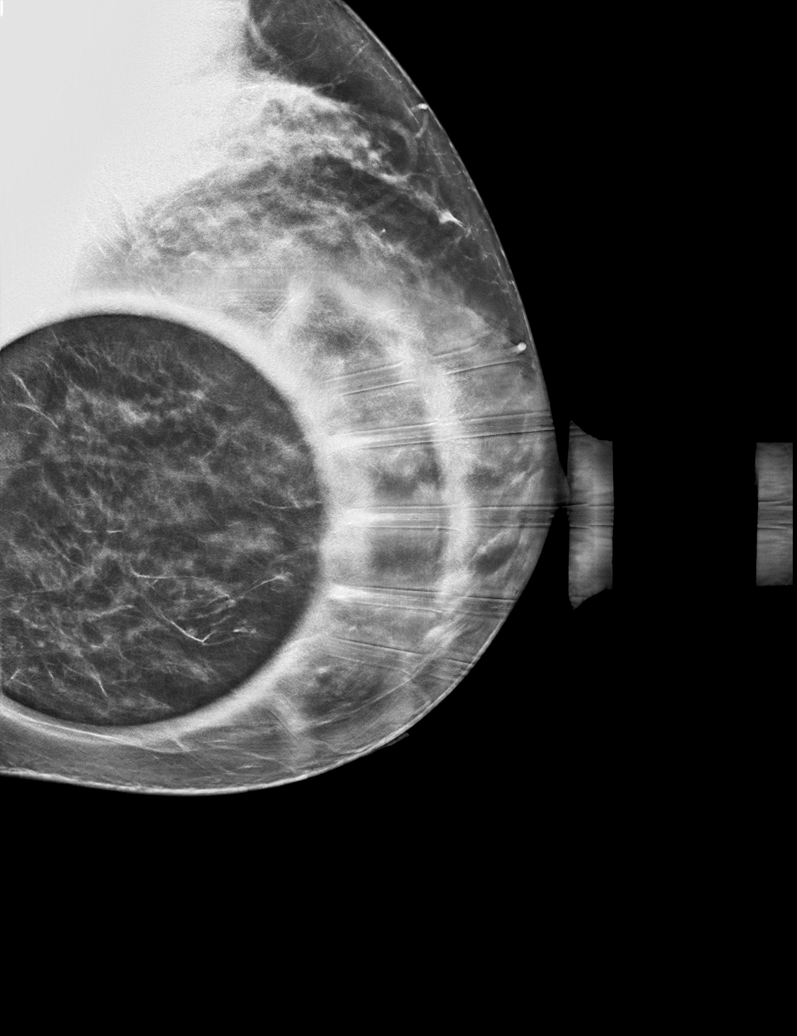

[L ML synth-2D]
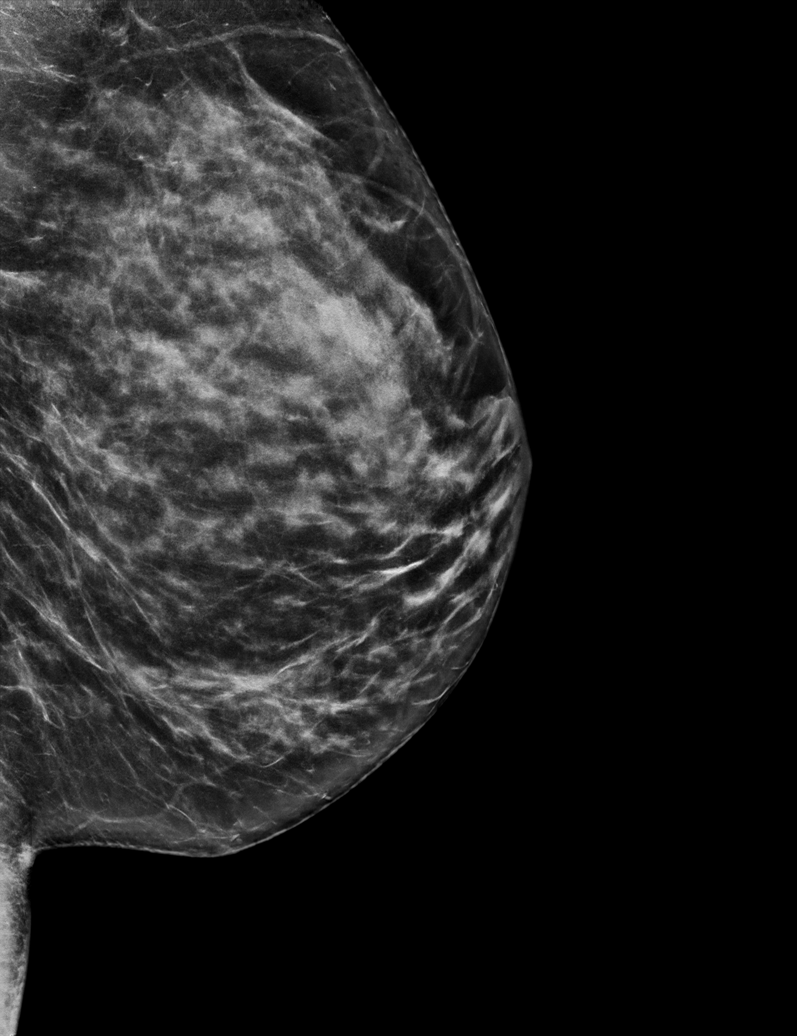

[L CC tomo (1 of 2) · tomo slice 35/68.0]
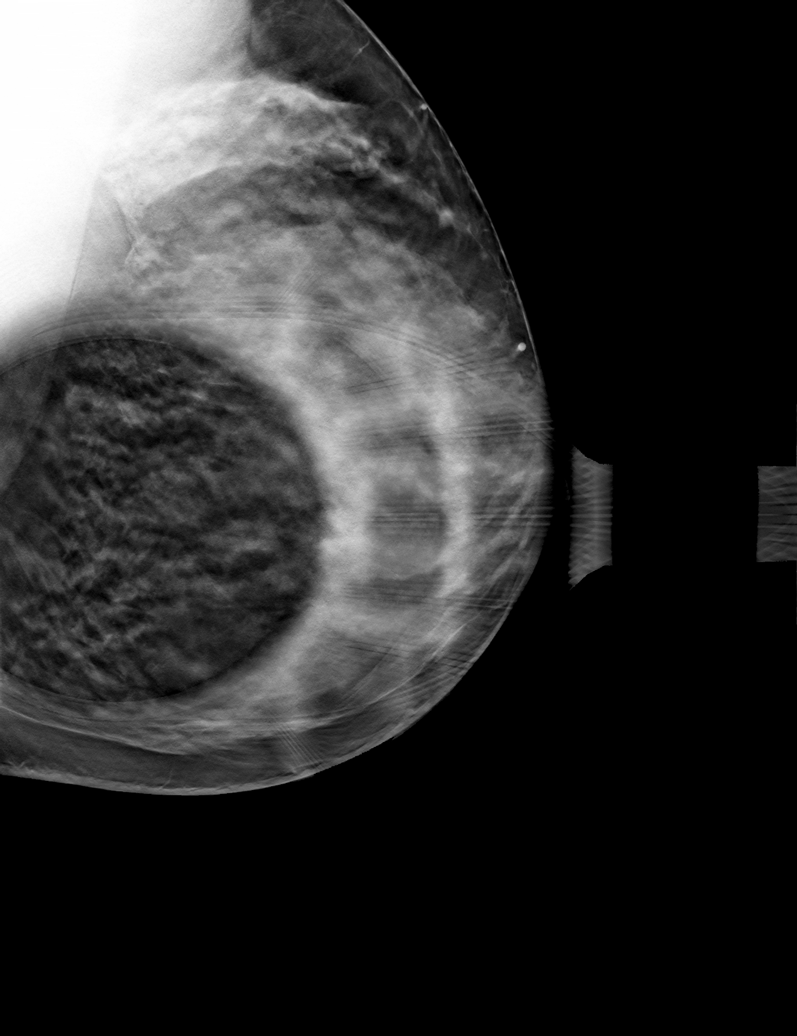

[L ML tomo · tomo slice 35/68.0]
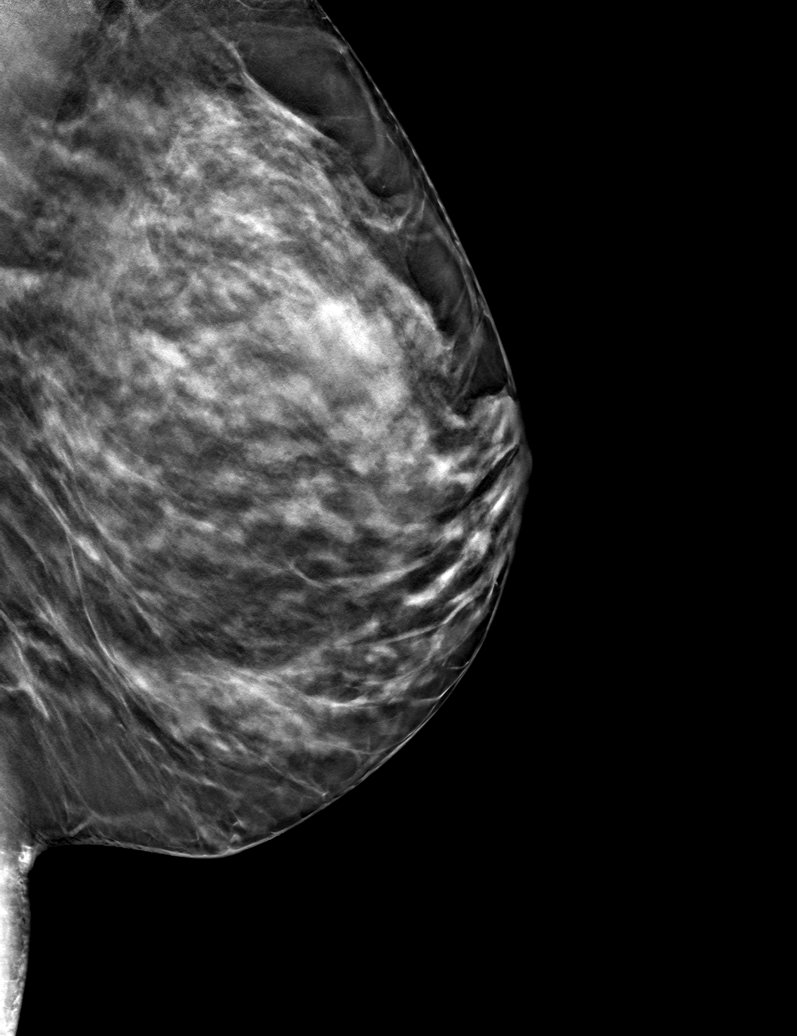

[L CC tomo (2 of 2) · tomo slice 39/76.0]
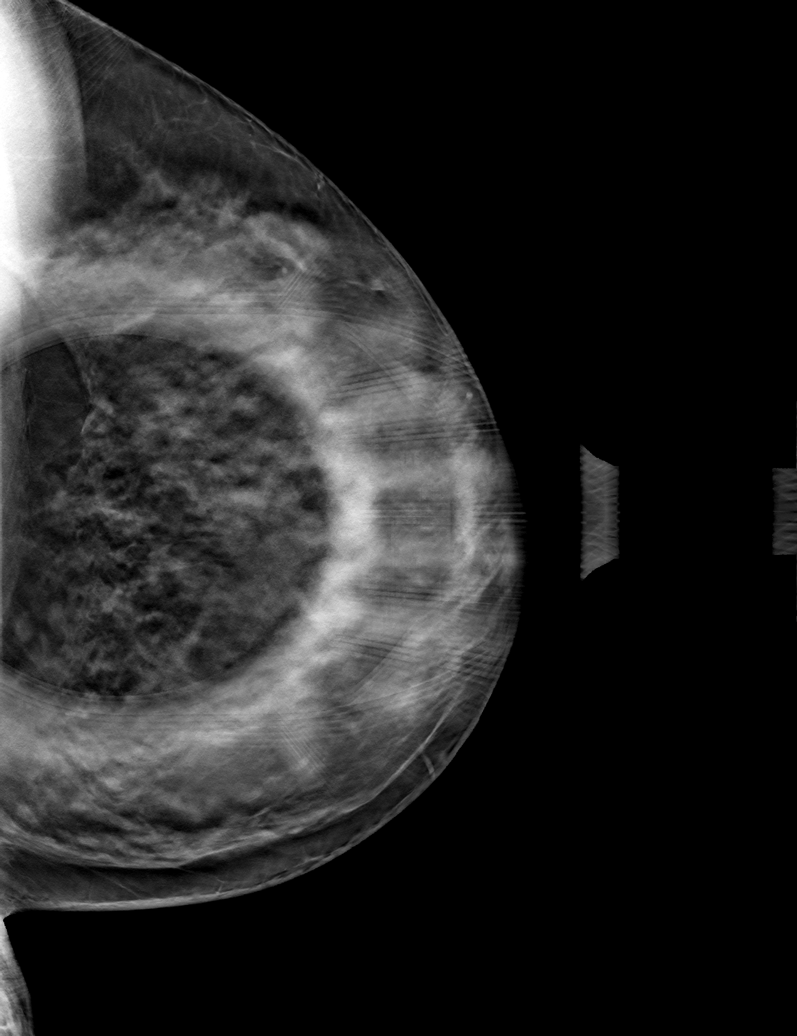

[6 of 18 positions shown; findings below may reference images not displayed]

ACR Breast Density Category c: The breast tissue is heterogeneously
dense, which may obscure small masses.
FINDINGS: The left breast asymmetry resolves on additional imaging. No
evidence of malignancy in the left breast.

Mammographic images were processed with CAD.
IMPRESSION: No mammographic evidence of malignancy on the left.

RECOMMENDATION:
Annual screening mammography.

I have discussed the findings and recommendations with the patient.
Results were also provided in writing at the conclusion of the
visit. If applicable, a reminder letter will be sent to the patient
regarding the next appointment.

BI-RADS CATEGORY  2: Benign.

## 2023-01-27 ENCOUNTER — Other Ambulatory Visit: Payer: Self-pay | Admitting: Obstetrics and Gynecology

## 2023-01-27 DIAGNOSIS — Z1231 Encounter for screening mammogram for malignant neoplasm of breast: Secondary | ICD-10-CM

## 2023-04-06 ENCOUNTER — Ambulatory Visit
Admission: RE | Admit: 2023-04-06 | Discharge: 2023-04-06 | Disposition: A | Discharge status: Home / Self Care | Payer: 59 | Source: Ambulatory Visit | Attending: Obstetrics and Gynecology | Admitting: Obstetrics and Gynecology

## 2023-04-06 DIAGNOSIS — Z1231 Encounter for screening mammogram for malignant neoplasm of breast: Secondary | ICD-10-CM | POA: Diagnosis present

## 2024-01-18 ENCOUNTER — Other Ambulatory Visit: Payer: Self-pay | Admitting: Obstetrics and Gynecology

## 2024-01-18 DIAGNOSIS — Z1231 Encounter for screening mammogram for malignant neoplasm of breast: Secondary | ICD-10-CM

## 2024-04-11 ENCOUNTER — Ambulatory Visit
Admission: RE | Admit: 2024-04-11 | Discharge: 2024-04-11 | Disposition: A | Discharge status: Home / Self Care | Source: Ambulatory Visit | Attending: Obstetrics and Gynecology | Admitting: Obstetrics and Gynecology

## 2024-04-11 DIAGNOSIS — Z1231 Encounter for screening mammogram for malignant neoplasm of breast: Secondary | ICD-10-CM | POA: Diagnosis present
